# Patient Record
Sex: Female | Born: 1960 | Race: Asian | Hispanic: No | Marital: Married | Smoking: Never smoker
Health system: Southern US, Community
[De-identification: ages and names within clinical notes are randomized; demographics above are authoritative.]

## PROBLEM LIST (undated history)

## (undated) DIAGNOSIS — J45909 Unspecified asthma, uncomplicated: Secondary | ICD-10-CM

## (undated) DIAGNOSIS — I10 Essential (primary) hypertension: Secondary | ICD-10-CM

---

## 2002-02-14 ENCOUNTER — Other Ambulatory Visit: Admission: RE | Admit: 2002-02-14 | Discharge: 2002-02-14 | Payer: Self-pay | Admitting: Obstetrics & Gynecology

## 2002-09-09 ENCOUNTER — Inpatient Hospital Stay (HOSPITAL_COMMUNITY): Admission: AD | Admit: 2002-09-09 | Discharge: 2002-09-12 | Payer: Self-pay | Admitting: Obstetrics & Gynecology

## 2002-10-17 ENCOUNTER — Other Ambulatory Visit: Admission: RE | Admit: 2002-10-17 | Discharge: 2002-10-17 | Payer: Self-pay | Admitting: Obstetrics & Gynecology

## 2013-11-20 ENCOUNTER — Ambulatory Visit: Payer: Self-pay | Admitting: Cardiology

## 2016-09-30 ENCOUNTER — Encounter (HOSPITAL_BASED_OUTPATIENT_CLINIC_OR_DEPARTMENT_OTHER): Payer: Self-pay

## 2016-09-30 ENCOUNTER — Emergency Department (HOSPITAL_BASED_OUTPATIENT_CLINIC_OR_DEPARTMENT_OTHER)
Admission: EM | Admit: 2016-09-30 | Discharge: 2016-10-01 | Disposition: A | Discharge status: Home / Self Care | Payer: BLUE CROSS/BLUE SHIELD | Attending: Emergency Medicine | Admitting: Emergency Medicine

## 2016-09-30 DIAGNOSIS — E876 Hypokalemia: Secondary | ICD-10-CM | POA: Insufficient documentation

## 2016-09-30 DIAGNOSIS — Z79899 Other long term (current) drug therapy: Secondary | ICD-10-CM | POA: Insufficient documentation

## 2016-09-30 DIAGNOSIS — R1031 Right lower quadrant pain: Secondary | ICD-10-CM | POA: Diagnosis present

## 2016-09-30 DIAGNOSIS — J45909 Unspecified asthma, uncomplicated: Secondary | ICD-10-CM | POA: Diagnosis not present

## 2016-09-30 DIAGNOSIS — I10 Essential (primary) hypertension: Secondary | ICD-10-CM | POA: Insufficient documentation

## 2016-09-30 DIAGNOSIS — K5732 Diverticulitis of large intestine without perforation or abscess without bleeding: Secondary | ICD-10-CM | POA: Insufficient documentation

## 2016-09-30 HISTORY — DX: Essential (primary) hypertension: I10

## 2016-09-30 HISTORY — DX: Unspecified asthma, uncomplicated: J45.909

## 2016-09-30 LAB — COMPREHENSIVE METABOLIC PANEL WITH GFR
ALT: 25 U/L (ref 14–54)
AST: 27 U/L (ref 15–41)
Albumin: 4.1 g/dL (ref 3.5–5.0)
Alkaline Phosphatase: 50 U/L (ref 38–126)
Anion gap: 14 (ref 5–15)
BUN: 15 mg/dL (ref 6–20)
CO2: 26 mmol/L (ref 22–32)
Calcium: 9.2 mg/dL (ref 8.9–10.3)
Chloride: 95 mmol/L — ABNORMAL LOW (ref 101–111)
Creatinine, Ser: 0.68 mg/dL (ref 0.44–1.00)
GFR calc Af Amer: >60 mL/min
GFR calc non Af Amer: >60 mL/min
Glucose, Bld: 113 mg/dL — ABNORMAL HIGH (ref 65–99)
Potassium: 2.8 mmol/L — ABNORMAL LOW (ref 3.5–5.1)
Sodium: 135 mmol/L (ref 135–145)
Total Bilirubin: 0.6 mg/dL (ref 0.3–1.2)
Total Protein: 8 g/dL (ref 6.5–8.1)

## 2016-09-30 LAB — CBC
HCT: 39.4 % (ref 36.0–46.0)
Hemoglobin: 13.6 g/dL (ref 12.0–15.0)
MCH: 29.1 pg (ref 26.0–34.0)
MCHC: 34.5 g/dL (ref 30.0–36.0)
MCV: 84.2 fL (ref 78.0–100.0)
Platelets: 261 K/uL (ref 150–400)
RBC: 4.68 MIL/uL (ref 3.87–5.11)
RDW: 12.9 % (ref 11.5–15.5)
WBC: 18.1 K/uL — ABNORMAL HIGH (ref 4.0–10.5)

## 2016-09-30 LAB — URINALYSIS, ROUTINE W REFLEX MICROSCOPIC
BILIRUBIN URINE: NEGATIVE
GLUCOSE, UA: NEGATIVE mg/dL
KETONES UR: NEGATIVE mg/dL
Leukocytes, UA: NEGATIVE
Nitrite: NEGATIVE
PH: 7.5 (ref 5.0–8.0)
Protein, ur: NEGATIVE mg/dL
SPECIFIC GRAVITY, URINE: 1.004 — AB (ref 1.005–1.030)

## 2016-09-30 LAB — URINALYSIS, MICROSCOPIC (REFLEX): WBC, UA: NONE SEEN WBC/hpf (ref 0–5)

## 2016-09-30 LAB — LIPASE, BLOOD: LIPASE: 28 U/L (ref 11–51)

## 2016-09-30 NOTE — ED Triage Notes (Signed)
C/o abd pain x 2-3 days-NAD-steady gait

## 2016-09-30 NOTE — ED Provider Notes (Addendum)
[facility] Provider Note: Lowella Dell, MD, FACEP  CSN: [id number] MRN: [id number] ARRIVAL: 09/30/16 at 2156 ROOM: MH09/[facility]09   CHIEF COMPLAINT  Abdominal Pain   HISTORY OF PRESENT ILLNESS  09/30/16 11:56 PM Alejandra Long is a 56 y.o. female with a two-day history of right lower quadrant pain. She rates her pain as a 7 out of 10 and describes it as "a pain". The onset was gradual. There has been associated nausea and anorexia but no vomiting or diarrhea. She was noted to have a low-grade fever 100.5 on arrival. Pain is worse with movement or palpation. She has had chills as well but no dysuria.   Past Medical History:  Diagnosis Date  . Asthma   . Hypertension     History reviewed. No pertinent surgical history.  No family history on file.  Social History  Substance Use Topics  . Smoking status: Never Smoker  . Smokeless tobacco: Never Used  . Alcohol use No    Prior to Admission medications   Medication Sig Start Date End Date Taking? Authorizing Provider  AMLODIPINE BESYLATE PO Take by mouth.   Yes [provider]  HYDRALAZINE-HCTZ PO Take by mouth.   Yes [provider]    Allergies Patient has no known allergies.   REVIEW OF SYSTEMS  Negative except as noted here or in the History of Present Illness.   PHYSICAL EXAMINATION  Initial Vital Signs Blood pressure 140/90, pulse 96, temperature (!) 100.5 F (38.1 C), temperature source Oral, resp. rate 18, height 5' (1.524 m), weight 49.9 kg (110 lb), SpO2 100 %.  Examination General: Well-developed, well-nourished female in no acute distress; appearance consistent with age of record HENT: normocephalic; atraumatic Eyes: pupils equal, round and reactive to light; extraocular muscles intact Neck: supple Heart: regular rate and rhythm Lungs: clear to auscultation bilaterally Abdomen: soft; nondistended; right lower quadrant tenderness; no masses or hepatosplenomegaly; bowel  sounds present Extremities: No deformity; full range of motion; pulses normal Neurologic: Awake, alert and oriented; motor function intact in all extremities and symmetric; no facial droop Skin: Warm and dry Psychiatric: Normal mood and affect   RESULTS  Summary of this visit's results, reviewed by myself:   EKG Interpretation  Date/Time:    Ventricular Rate:    PR Interval:    QRS Duration:   QT Interval:    QTC Calculation:   R Axis:     Text Interpretation:        Laboratory Studies: Results for orders placed or performed during the hospital encounter of 09/30/16 (from the past 24 hour(s))  Urinalysis, Routine w reflex microscopic     Status: Abnormal   Collection Time: 09/30/16 10:00 PM  Result Value Ref Range   Color, Urine YELLOW YELLOW   APPearance CLEAR CLEAR   Specific Gravity, Urine 1.004 (L) 1.005 - 1.030   pH 7.5 5.0 - 8.0   Glucose, UA NEGATIVE NEGATIVE mg/dL   Hgb urine dipstick SMALL (A) NEGATIVE   Bilirubin Urine NEGATIVE NEGATIVE   Ketones, ur NEGATIVE NEGATIVE mg/dL   Protein, ur NEGATIVE NEGATIVE mg/dL   Nitrite NEGATIVE NEGATIVE   Leukocytes, UA NEGATIVE NEGATIVE  Urinalysis, Microscopic (reflex)     Status: Abnormal   Collection Time: 09/30/16 10:00 PM  Result Value Ref Range   RBC / HPF 0-5 0 - 5 RBC/hpf   WBC, UA NONE SEEN 0 - 5 WBC/hpf   Bacteria, UA RARE (A) NONE SEEN   Squamous Epithelial / LPF 0-5 (  A) NONE SEEN  Lipase, blood     Status: None   Collection Time: 09/30/16 10:14 PM  Result Value Ref Range   Lipase 28 11 - 51 U/L  Comprehensive metabolic panel     Status: Abnormal   Collection Time: 09/30/16 10:14 PM  Result Value Ref Range   Sodium 135 135 - 145 mmol/L   Potassium 2.8 (L) 3.5 - 5.1 mmol/L   Chloride 95 (L) 101 - 111 mmol/L   CO2 26 22 - 32 mmol/L   Glucose, Bld 113 (H) 65 - 99 mg/dL   BUN 15 6 - 20 mg/dL   Creatinine, Ser 1.61 0.44 - 1.00 mg/dL   Calcium 9.2 8.9 - 09.6 mg/dL   Total Protein 8.0 6.5 - 8.1 g/dL    Albumin 4.1 3.5 - 5.0 g/dL   AST 27 15 - 41 U/L   ALT 25 14 - 54 U/L   Alkaline Phosphatase 50 38 - 126 U/L   Total Bilirubin 0.6 0.3 - 1.2 mg/dL   GFR calc non Af Amer >60 >60 mL/min   GFR calc Af Amer >60 >60 mL/min   Anion gap 14 5 - 15  CBC     Status: Abnormal   Collection Time: 09/30/16 10:14 PM  Result Value Ref Range   WBC 18.1 (H) 4.0 - 10.5 K/uL   RBC 4.68 3.87 - 5.11 MIL/uL   Hemoglobin 13.6 12.0 - 15.0 g/dL   HCT 04.5 40.9 - 81.1 %   MCV 84.2 78.0 - 100.0 fL   MCH 29.1 26.0 - 34.0 pg   MCHC 34.5 30.0 - 36.0 g/dL   RDW 91.4 78.2 - 95.6 %   Platelets 261 150 - 400 K/uL   Imaging Studies: Ct Abdomen Pelvis W Contrast  Result Date: 10/01/2016 CLINICAL DATA:  Acute onset of right lower quadrant abdominal pain. Leukocytosis. Initial encounter. EXAM: CT ABDOMEN AND PELVIS WITH CONTRAST TECHNIQUE: Multidetector CT imaging of the abdomen and pelvis was performed using the standard protocol following bolus administration of intravenous contrast. CONTRAST:  ISOVUE-300 IOPAMIDOL (ISOVUE-300) INJECTION 61% COMPARISON:  None. FINDINGS: Lower chest: The visualized lung bases are grossly clear. The visualized portions of the mediastinum are unremarkable. Hepatobiliary: The liver is unremarkable in appearance. The gallbladder is unremarkable in appearance. The common bile duct remains normal in caliber. Pancreas: The pancreas is within normal limits. Spleen: The spleen is unremarkable in appearance. Adrenals/Urinary Tract: The adrenal glands are unremarkable in appearance. Scattered small bilateral renal cysts are seen. There is no evidence of hydronephrosis. No renal or ureteral stones are identified. Right-sided perinephric stranding reflects the adjacent colonic process. Stomach/Bowel: There is marked wall thickening along the ascending colon, with surrounding soft tissue inflammation and fluid. Inflamed diverticula are noted, compatible with acute diverticulitis. There is no definite  evidence of perforation or abscess formation at this time. Scattered diverticulosis is noted along the ascending colon. The small bowel is grossly unremarkable in appearance. The stomach is within normal limits. The appendix is normal in caliber, without evidence of appendicitis. Vascular/Lymphatic: Scattered calcification is seen along the abdominal aorta and its branches. The abdominal aorta is otherwise grossly unremarkable. The inferior vena cava is grossly unremarkable. No retroperitoneal lymphadenopathy is seen. No pelvic sidewall lymphadenopathy is identified. Reproductive: The bladder is mildly distended and grossly unremarkable. Several uterine fibroids are noted, with scattered calcification. The ovaries are grossly symmetric. No suspicious adnexal masses are seen. Other: No additional soft tissue abnormalities are seen. Musculoskeletal: No acute osseous abnormalities  are identified. The visualized musculature is unremarkable in appearance. IMPRESSION: 1. Marked wall thickening along the ascending colon, with surrounding soft tissue inflammation and fluid. Inflamed diverticula noted, compatible with acute diverticulitis. No definite evidence of perforation or abscess formation at this time. 2. Scattered diverticulosis along the ascending colon. 3. Scattered aortic atherosclerosis. 4. Small bilateral renal cysts noted. 5. Several uterine fibroids noted, with scattered calcification. Electronically Signed   By: Roanna RaiderJeffery  Chang M.D.   On: 10/01/2016 02:41    ED COURSE  Nursing notes and initial vitals signs, including pulse oximetry, reviewed.  Vitals:   09/30/16 2213 10/01/16 0018 10/01/16 0330  BP: 140/90 (!) 141/96 103/78  Pulse: 96 97 84  Resp: 18 13 18   Temp: (!) 100.5 F (38.1 C)    TempSrc: Oral    SpO2: 100% 97% 100%  Weight: 49.9 kg (110 lb)    Height: 5' (1.524 m)     Patient given IV Cipro and Flagyl in the ED. We'll send her home on oral Cipro and Flagyl. She was advised to return  for worsening symptoms.  PROCEDURES    ED DIAGNOSES     ICD-10-CM   1. Diverticulitis of large intestine without perforation or abscess without bleeding K57.32   2. Hypokalemia E87.6        Evangelene Vora, Jonny RuizJohn, MD 10/01/16 0436    Paula LibraMolpus, Jhene Westmoreland, MD 10/01/16 [phone]

## 2016-10-01 ENCOUNTER — Emergency Department (HOSPITAL_BASED_OUTPATIENT_CLINIC_OR_DEPARTMENT_OTHER): Payer: BLUE CROSS/BLUE SHIELD

## 2016-10-01 MED ORDER — POTASSIUM CHLORIDE CRYS ER 20 MEQ PO TBCR
40.0000 meq | EXTENDED_RELEASE_TABLET | Freq: Once | ORAL | Status: AC
  Administered 2016-10-01: 40 meq via ORAL
  Filled 2016-10-01: qty 2

## 2016-10-01 MED ORDER — METRONIDAZOLE 500 MG PO TABS: 500.0000 mg | ORAL_TABLET | Freq: Three times a day (TID) | ORAL | 0 refills | Status: DC

## 2016-10-01 MED ORDER — METRONIDAZOLE IN NACL 5-0.79 MG/ML-% IV SOLN
500.0000 mg | Freq: Once | INTRAVENOUS | Status: AC
  Administered 2016-10-01: 500 mg via INTRAVENOUS
  Filled 2016-10-01: qty 100

## 2016-10-01 MED ORDER — IOPAMIDOL (ISOVUE-300) INJECTION 61%
100.0000 mL | Freq: Once | INTRAVENOUS | Status: AC | PRN
  Administered 2016-10-01: 100 mL via INTRAVENOUS

## 2016-10-01 MED ORDER — SODIUM CHLORIDE 0.9 % IV SOLN
Freq: Once | INTRAVENOUS | Status: AC
  Administered 2016-10-01: 1000 mL via INTRAVENOUS

## 2016-10-01 MED ORDER — CIPROFLOXACIN HCL 500 MG PO TABS: 500.0000 mg | ORAL_TABLET | Freq: Two times a day (BID) | ORAL | 0 refills | Status: DC

## 2016-10-01 MED ORDER — POTASSIUM CHLORIDE CRYS ER 20 MEQ PO TBCR: 20.0000 meq | EXTENDED_RELEASE_TABLET | Freq: Two times a day (BID) | ORAL | 0 refills | Status: DC

## 2016-10-01 MED ORDER — CIPROFLOXACIN IN D5W 400 MG/200ML IV SOLN
400.0000 mg | Freq: Once | INTRAVENOUS | Status: AC
  Administered 2016-10-01: 400 mg via INTRAVENOUS
  Filled 2016-10-01: qty 200

## 2016-10-01 MED ORDER — ONDANSETRON HCL 4 MG/2ML IJ SOLN
4.0000 mg | Freq: Once | INTRAMUSCULAR | Status: DC
  Filled 2016-10-01: qty 2

## 2017-01-21 DIAGNOSIS — E785 Hyperlipidemia, unspecified: Secondary | ICD-10-CM | POA: Diagnosis not present

## 2017-02-10 DIAGNOSIS — I10 Essential (primary) hypertension: Secondary | ICD-10-CM | POA: Diagnosis not present

## 2017-02-10 DIAGNOSIS — K5792 Diverticulitis of intestine, part unspecified, without perforation or abscess without bleeding: Secondary | ICD-10-CM | POA: Diagnosis not present

## 2017-02-10 DIAGNOSIS — E785 Hyperlipidemia, unspecified: Secondary | ICD-10-CM | POA: Diagnosis not present

## 2017-02-10 DIAGNOSIS — E876 Hypokalemia: Secondary | ICD-10-CM | POA: Diagnosis not present

## 2017-02-18 DIAGNOSIS — M25511 Pain in right shoulder: Secondary | ICD-10-CM | POA: Diagnosis not present

## 2017-02-24 DIAGNOSIS — M25511 Pain in right shoulder: Secondary | ICD-10-CM | POA: Diagnosis not present

## 2017-02-28 DIAGNOSIS — M25511 Pain in right shoulder: Secondary | ICD-10-CM | POA: Diagnosis not present

## 2017-03-03 DIAGNOSIS — M25511 Pain in right shoulder: Secondary | ICD-10-CM | POA: Diagnosis not present

## 2017-03-08 DIAGNOSIS — M25511 Pain in right shoulder: Secondary | ICD-10-CM | POA: Diagnosis not present

## 2017-03-16 DIAGNOSIS — M25511 Pain in right shoulder: Secondary | ICD-10-CM | POA: Diagnosis not present

## 2017-03-23 DIAGNOSIS — M25511 Pain in right shoulder: Secondary | ICD-10-CM | POA: Diagnosis not present

## 2017-04-01 DIAGNOSIS — M25511 Pain in right shoulder: Secondary | ICD-10-CM | POA: Diagnosis not present

## 2017-04-05 DIAGNOSIS — M25511 Pain in right shoulder: Secondary | ICD-10-CM | POA: Diagnosis not present

## 2017-04-14 DIAGNOSIS — M25511 Pain in right shoulder: Secondary | ICD-10-CM | POA: Diagnosis not present

## 2017-08-09 DIAGNOSIS — E785 Hyperlipidemia, unspecified: Secondary | ICD-10-CM | POA: Diagnosis not present

## 2017-08-09 DIAGNOSIS — I10 Essential (primary) hypertension: Secondary | ICD-10-CM | POA: Diagnosis not present

## 2017-08-12 DIAGNOSIS — E785 Hyperlipidemia, unspecified: Secondary | ICD-10-CM | POA: Diagnosis not present

## 2017-08-12 DIAGNOSIS — I10 Essential (primary) hypertension: Secondary | ICD-10-CM | POA: Diagnosis not present

## 2018-02-14 DIAGNOSIS — E785 Hyperlipidemia, unspecified: Secondary | ICD-10-CM | POA: Diagnosis not present

## 2018-02-14 DIAGNOSIS — I10 Essential (primary) hypertension: Secondary | ICD-10-CM | POA: Diagnosis not present

## 2018-02-21 DIAGNOSIS — I10 Essential (primary) hypertension: Secondary | ICD-10-CM | POA: Diagnosis not present

## 2018-02-21 DIAGNOSIS — E785 Hyperlipidemia, unspecified: Secondary | ICD-10-CM | POA: Diagnosis not present

## 2018-04-26 ENCOUNTER — Encounter: Payer: Self-pay | Admitting: Obstetrics & Gynecology

## 2018-04-26 ENCOUNTER — Other Ambulatory Visit: Payer: Self-pay

## 2018-04-26 ENCOUNTER — Ambulatory Visit (INDEPENDENT_AMBULATORY_CARE_PROVIDER_SITE_OTHER): Payer: BLUE CROSS/BLUE SHIELD | Admitting: Obstetrics & Gynecology

## 2018-04-26 VITALS — BP 130/80 | Ht <= 58 in | Wt 109.0 lb

## 2018-04-26 DIAGNOSIS — Z9189 Other specified personal risk factors, not elsewhere classified: Secondary | ICD-10-CM

## 2018-04-26 DIAGNOSIS — Z1151 Encounter for screening for human papillomavirus (HPV): Secondary | ICD-10-CM

## 2018-04-26 DIAGNOSIS — Z780 Asymptomatic menopausal state: Secondary | ICD-10-CM

## 2018-04-26 DIAGNOSIS — Z01419 Encounter for gynecological examination (general) (routine) without abnormal findings: Secondary | ICD-10-CM

## 2018-04-26 NOTE — Progress Notes (Signed)
Alejandra Long April 08, 1960 [id number]   History:    58 y.o. G3P3L3 Married.  Vasectomy.  3 daughters 96, 59, 69 yo.  2 oldest in [place], youngest [employer].  RP:  New (last seen in 2011) patient presenting for annual gyn exam   HPI: Menopause x3 years, well on no HRT.  No PMB.  No pelvic pain.  No pain with IC.  Urine/BMs normal.  Breasts normal.  BMI 22.78.  Good fitness.  Health Labs with Fam MD.  Treated for Hypercholesterolemia and HTN.  Past medical history,surgical history, family history and social history were all reviewed and documented in the EPIC chart.  Gynecologic History No LMP recorded. Patient is postmenopausal. Contraception: post menopausal status and vasectomy Last Pap: 05/2009. Results were: Negative Last mammogram: Never Bone Density: Never Colonoscopy: Never  Obstetric History OB History  Gravida Para Term Preterm AB Living  3 3       3   SAB TAB Ectopic Multiple Live Births               # Outcome Date GA Lbr Len/2nd Weight Sex Delivery Anes PTL Lv  3 Para           2 Para           1 Para              ROS: A ROS was performed and pertinent positives and negatives are included in the history.  GENERAL: No fevers or chills. HEENT: No change in vision, no earache, sore throat or sinus congestion. NECK: No pain or stiffness. CARDIOVASCULAR: No chest pain or pressure. No palpitations. PULMONARY: No shortness of breath, cough or wheeze. GASTROINTESTINAL: No abdominal pain, nausea, vomiting or diarrhea, melena or bright red blood per rectum. GENITOURINARY: No urinary frequency, urgency, hesitancy or dysuria. MUSCULOSKELETAL: No joint or muscle pain, no back pain, no recent trauma. DERMATOLOGIC: No rash, no itching, no lesions. ENDOCRINE: No polyuria, polydipsia, no heat or cold intolerance. No recent change in weight. HEMATOLOGICAL: No anemia or easy bruising or bleeding. NEUROLOGIC: No headache, seizures, numbness, tingling or weakness.  PSYCHIATRIC: No depression, no loss of interest in normal activity or change in sleep pattern.     Exam:   BP 130/80   Ht 4\' 10"  (1.473 m)   Wt 109 lb (49.4 kg)   BMI 22.78 kg/m   Body mass index is 22.78 kg/m.  General appearance : Well developed well nourished female. No acute distress HEENT: Eyes: no retinal hemorrhage or exudates,  Neck supple, trachea midline, no carotid bruits, no thyroidmegaly Lungs: Clear to auscultation, no rhonchi or wheezes, or rib retractions  Heart: Regular rate and rhythm, no murmurs or gallops Breast:Examined in sitting and supine position were symmetrical in appearance, no palpable masses or tenderness,  no skin retraction, no nipple inversion, no nipple discharge, no skin discoloration, no axillary or supraclavicular lymphadenopathy Abdomen: no palpable masses or tenderness, no rebound or guarding Extremities: no edema or skin discoloration or tenderness  Pelvic: Vulva: Normal             Vagina: No gross lesions or discharge  Cervix: No gross lesions or discharge.  Pap/HPV HR done.  Uterus  AV, normal size, shape and consistency, non-tender and mobile  Adnexa  Without masses or tenderness  Anus: Normal   Assessment/Plan:  59 y.o. female for annual exam   1. Encounter for routine gynecological examination with Papanicolaou smear of cervix Normal gynecologic exam in menopause.  Pap/HPV HR done today.  Breast exam normal.  [facility]  [facility] to [facility] for a Screening Colonoscopy.  Health labs with Fam MD and management of cHTN/Hypercholesterolemia.  Good BMI at 22.78.  Healthy nutrition and Fitness with aerobic activities 5 times a week and weightlifting every 2 days.  2. Relies on partner vasectomy for contraception And Postmenopausal.  3. Postmenopausal Well on no HRT.  No PMB.  Continue on Vit D supplements and Ca++ intake of 1.5 g/day including supplements and nutritional.  Regular weight bearing  physical activity.  Will do a Bone Density at or before 58 yo.  Other orders - rosuvastatin (CRESTOR) 5 MG tablet; Take 5 mg by mouth daily. - triamterene-hydrochlorothiazide (DYAZIDE) 37.5-25 MG capsule; Take 1 capsule by mouth daily. - Multiple Vitamin (MULTIVITAMIN) tablet; Take 1 tablet by mouth daily. - Calcium Carb-Cholecalciferol (CALCIUM 1000 + D PO); Take by mouth. - Omega-3 Fatty Acids (FISH OIL) 1000 MG CPDR; Take by mouth.  Alejandra Del MD, 10:25 AM 04/26/2018

## 2018-04-26 NOTE — Addendum Note (Signed)
Addended by: Berna Spare A on: 04/26/2018 11:45 AM   Modules accepted: Orders

## 2018-04-26 NOTE — Patient Instructions (Signed)
1. Encounter for routine gynecological examination with Papanicolaou smear of cervix Normal gynecologic exam in menopause.  Pap/HPV HR done today.  Breast exam normal.  [facility]  [facility]er to [facility] for a Screening Colonoscopy.  Health labs with Fam MD and management of cHTN/Hypercholesterolemia.  Good BMI at 22.78.  Healthy nutrition and Fitness with aerobic activities 5 times a week and weightlifting every 2 days.  2. Relies on partner vasectomy for contraception And Postmenopausal.  3. Postmenopausal Well on no HRT.  No PMB.  Continue on Vit D supplements and Ca++ intake of 1.5 g/day including supplements and nutritional.  Regular weight bearing physical activity.  Will do a Bone Density at or before 58 yo.  Other orders - rosuvastatin (CRESTOR) 5 MG tablet; Take 5 mg by mouth daily. - triamterene-hydrochlorothiazide (DYAZIDE) 37.5-25 MG capsule; Take 1 capsule by mouth daily. - Multiple Vitamin (MULTIVITAMIN) tablet; Take 1 tablet by mouth daily. - Calcium Carb-Cholecalciferol (CALCIUM 1000 + D PO); Take by mouth. - Omega-3 Fatty Acids (FISH OIL) 1000 MG CPDR; Take by mouth.  Alejandra Long, it was a pleasure seeing you today!  I will inform you of your results as soon as they are available.

## 2018-04-27 LAB — PAP, TP IMAGING W/ HPV RNA, RFLX HPV TYPE 16,18/45: HPV DNA High Risk: NOT DETECTED

## 2018-08-24 DIAGNOSIS — E785 Hyperlipidemia, unspecified: Secondary | ICD-10-CM | POA: Diagnosis not present

## 2018-08-29 DIAGNOSIS — M25512 Pain in left shoulder: Secondary | ICD-10-CM | POA: Diagnosis not present

## 2018-08-29 DIAGNOSIS — M25511 Pain in right shoulder: Secondary | ICD-10-CM | POA: Diagnosis not present

## 2018-08-29 DIAGNOSIS — I10 Essential (primary) hypertension: Secondary | ICD-10-CM | POA: Diagnosis not present

## 2018-08-29 DIAGNOSIS — E785 Hyperlipidemia, unspecified: Secondary | ICD-10-CM | POA: Diagnosis not present

## 2018-11-01 DIAGNOSIS — Z23 Encounter for immunization: Secondary | ICD-10-CM | POA: Diagnosis not present

## 2018-11-15 ENCOUNTER — Other Ambulatory Visit: Payer: Self-pay | Admitting: Obstetrics & Gynecology

## 2018-11-15 DIAGNOSIS — Z1231 Encounter for screening mammogram for malignant neoplasm of breast: Secondary | ICD-10-CM

## 2018-12-01 DIAGNOSIS — L2089 Other atopic dermatitis: Secondary | ICD-10-CM | POA: Diagnosis not present

## 2018-12-01 DIAGNOSIS — D1801 Hemangioma of skin and subcutaneous tissue: Secondary | ICD-10-CM | POA: Diagnosis not present

## 2018-12-01 DIAGNOSIS — L821 Other seborrheic keratosis: Secondary | ICD-10-CM | POA: Diagnosis not present

## 2018-12-29 ENCOUNTER — Ambulatory Visit
Admission: RE | Admit: 2018-12-29 | Discharge: 2018-12-29 | Disposition: A | Discharge status: Home / Self Care | Payer: BC Managed Care – PPO | Source: Ambulatory Visit | Attending: Obstetrics & Gynecology | Admitting: Obstetrics & Gynecology

## 2018-12-29 ENCOUNTER — Other Ambulatory Visit: Payer: Self-pay

## 2018-12-29 DIAGNOSIS — Z1231 Encounter for screening mammogram for malignant neoplasm of breast: Secondary | ICD-10-CM | POA: Diagnosis not present

## 2019-01-01 DIAGNOSIS — E785 Hyperlipidemia, unspecified: Secondary | ICD-10-CM | POA: Diagnosis not present

## 2019-01-01 DIAGNOSIS — Z23 Encounter for immunization: Secondary | ICD-10-CM | POA: Diagnosis not present

## 2019-01-01 DIAGNOSIS — I10 Essential (primary) hypertension: Secondary | ICD-10-CM | POA: Diagnosis not present

## 2019-01-05 DIAGNOSIS — I10 Essential (primary) hypertension: Secondary | ICD-10-CM | POA: Diagnosis not present

## 2019-01-05 DIAGNOSIS — E785 Hyperlipidemia, unspecified: Secondary | ICD-10-CM | POA: Diagnosis not present

## 2019-03-30 ENCOUNTER — Ambulatory Visit: Payer: 59

## 2019-05-03 ENCOUNTER — Encounter: Payer: BLUE CROSS/BLUE SHIELD | Admitting: Obstetrics & Gynecology

## 2021-06-13 IMAGING — MG DIGITAL SCREENING BILAT W/ TOMO W/ CAD
8 series · 9 of 24 positions shown · non-contrast
Comparison: None.

CLINICAL DATA: Screening.

EXAM:
DIGITAL SCREENING BILATERAL MAMMOGRAM WITH TOMO AND CAD

[R CC synth-2D]
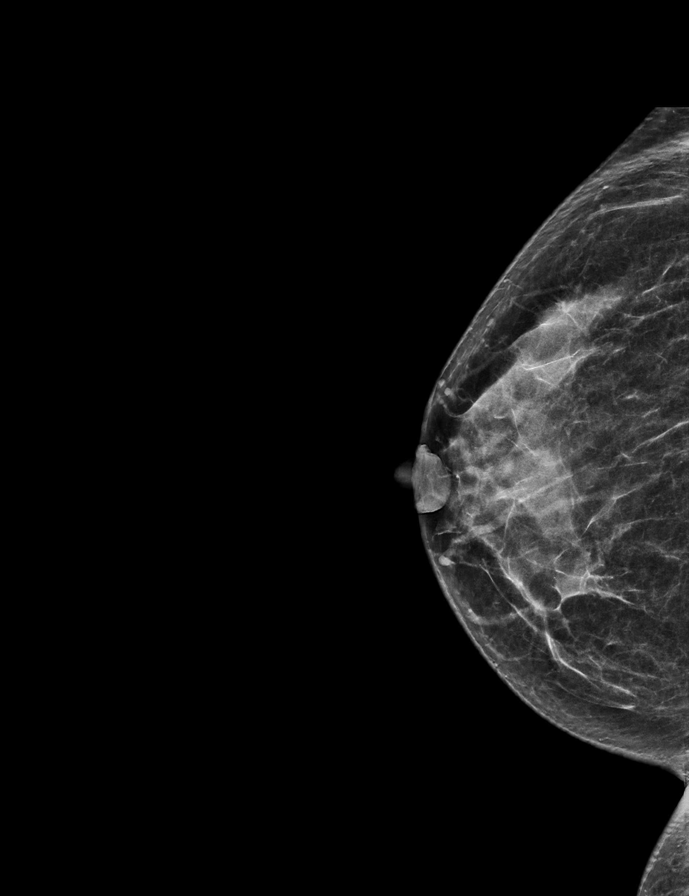

[L MLO synth-2D]
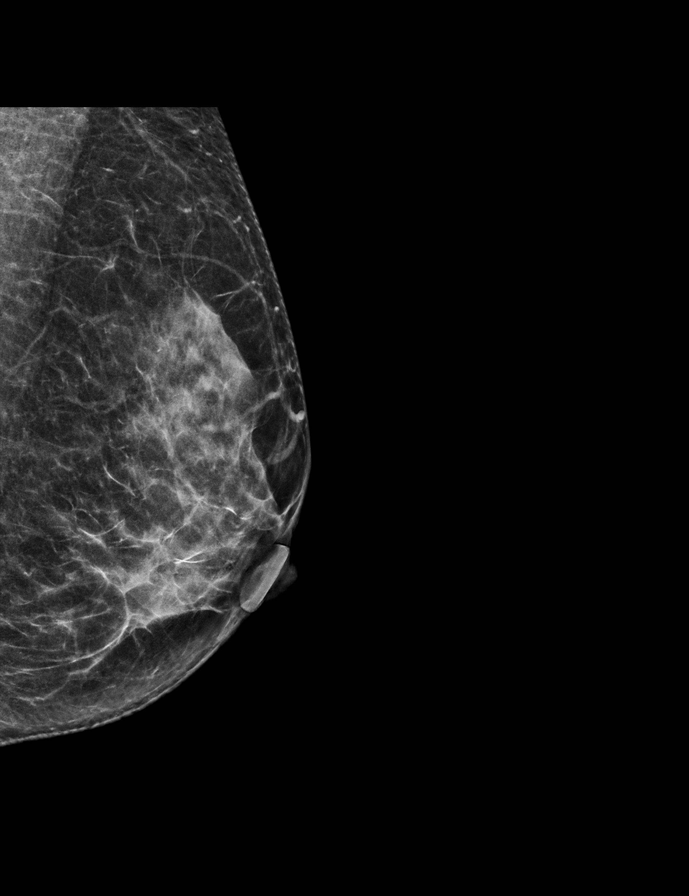

[L CC synth-2D]
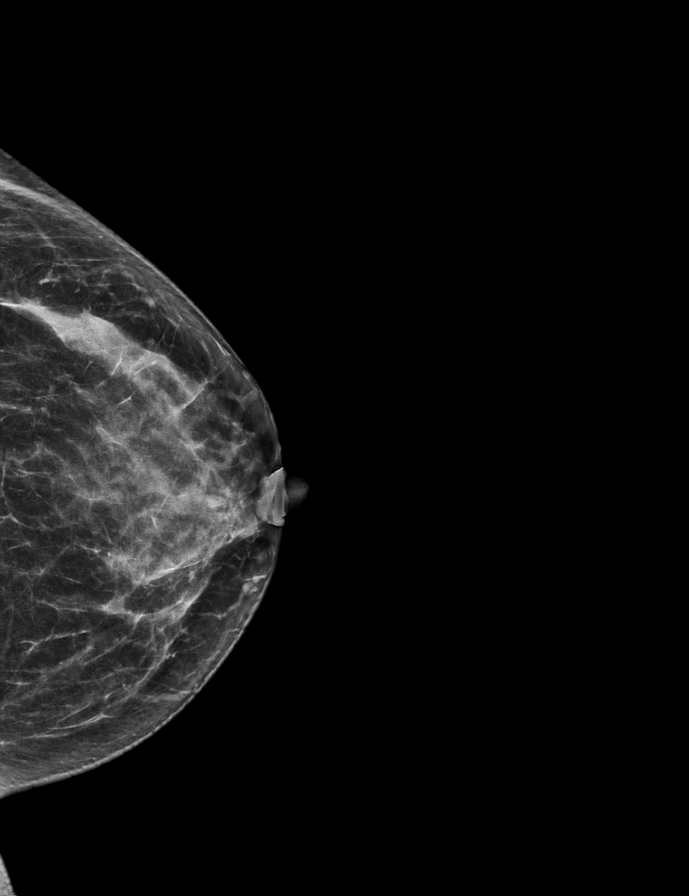

[R MLO synth-2D]
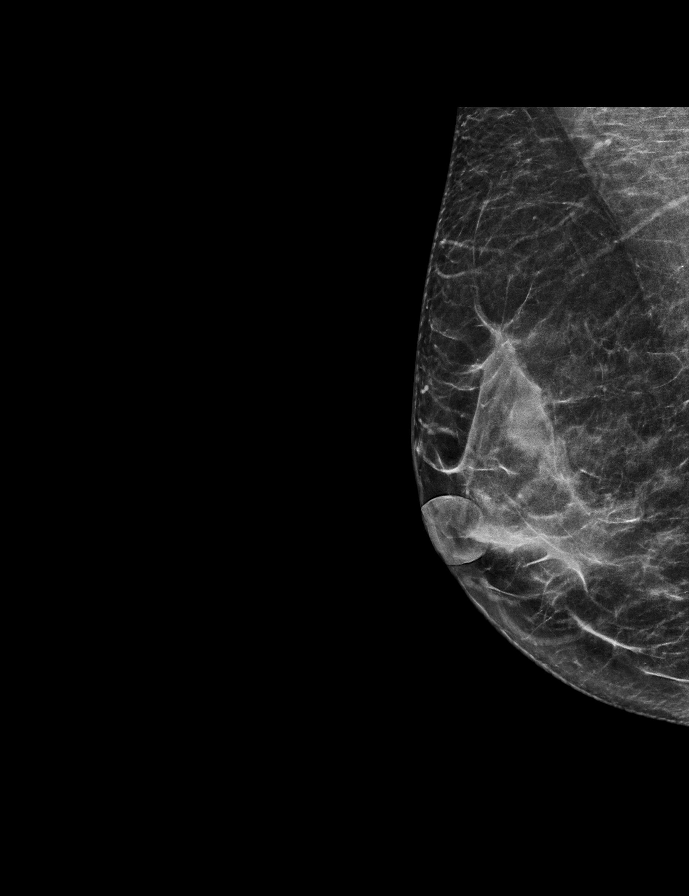

[L CC tomo · 2 of 57 frames shown]
[frame 19/57]
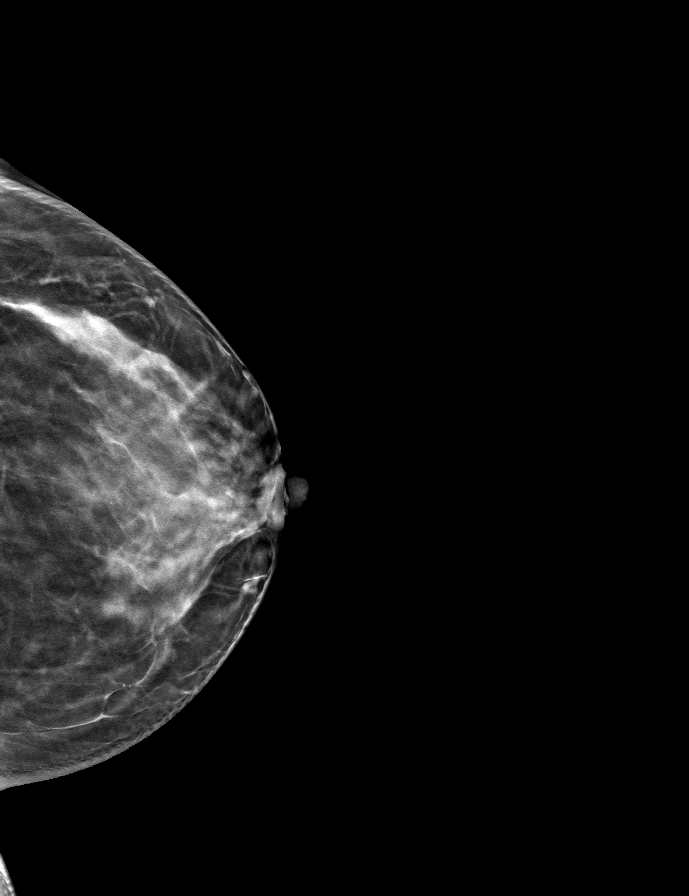
[frame 29/57]
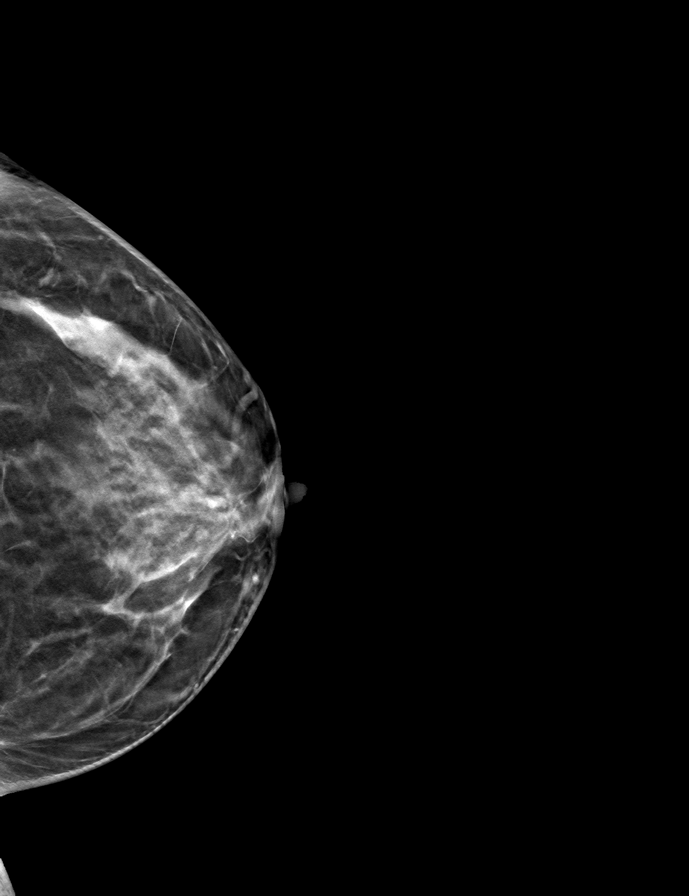

[R CC tomo · tomo slice 28/55.0]
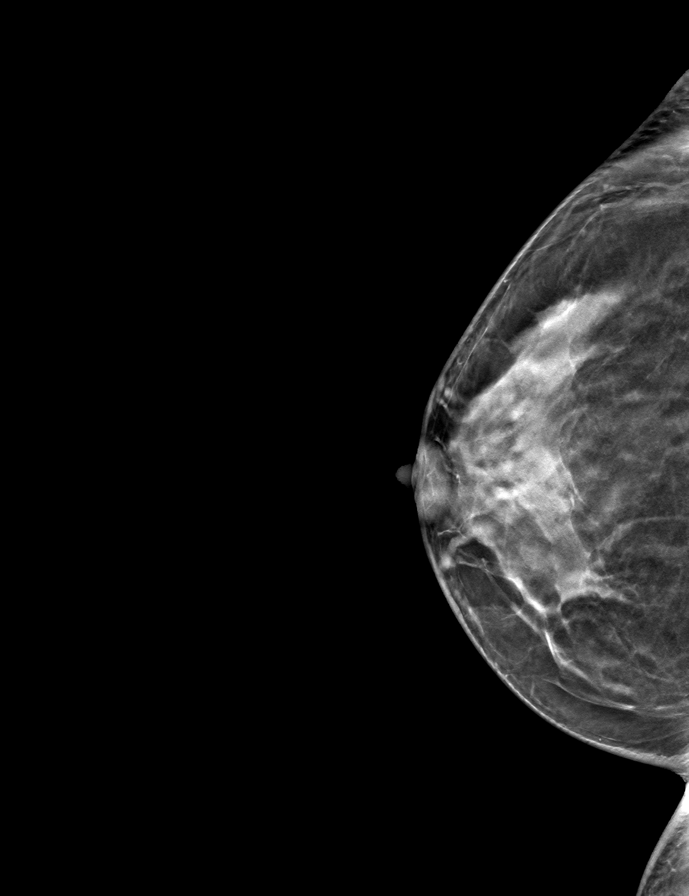

[R MLO tomo · tomo slice 27/53.0]
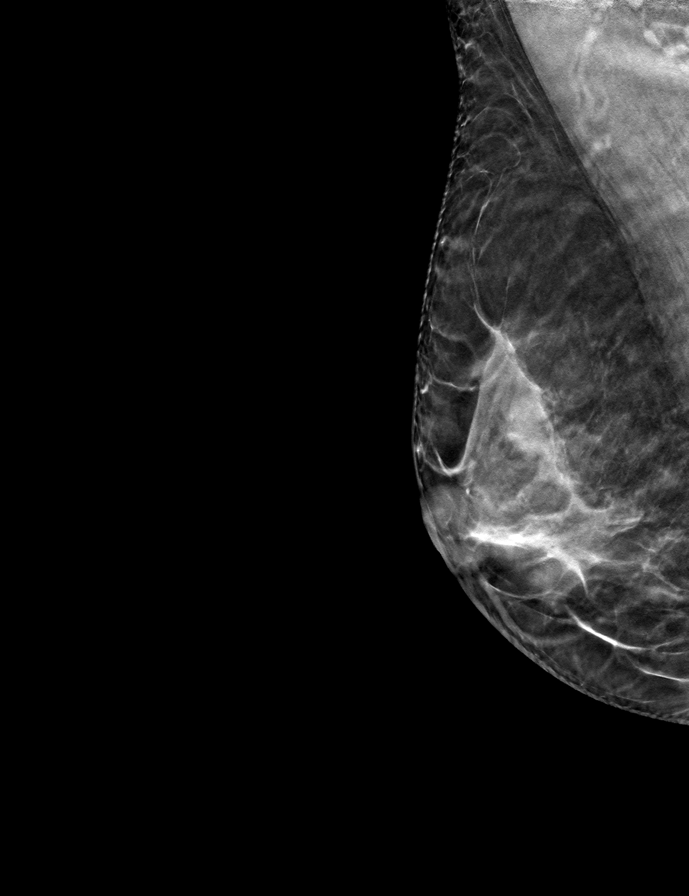

[L MLO tomo · tomo slice 27/53.0]
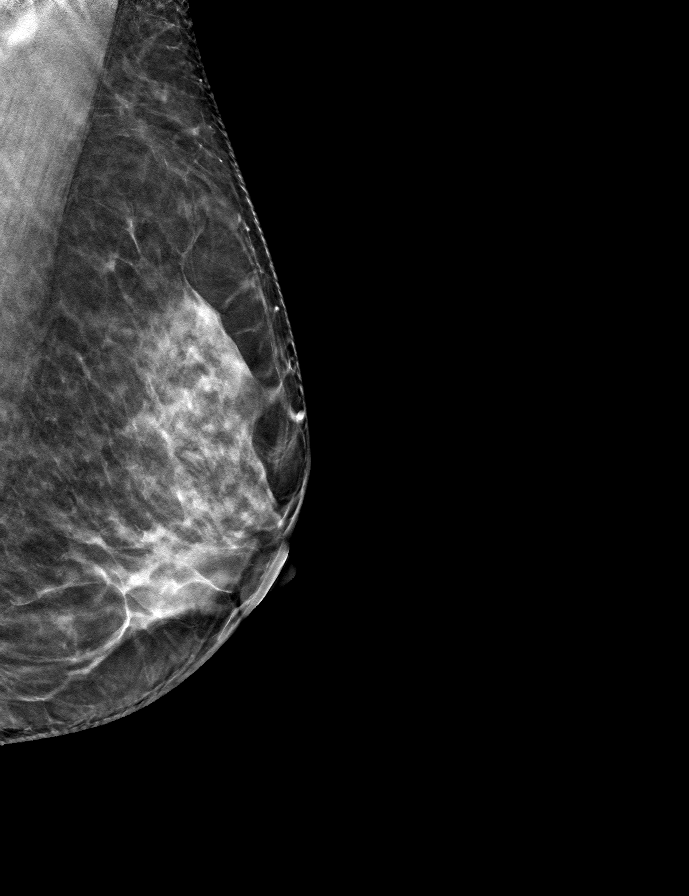

[9 of 24 positions shown; findings below may reference images not displayed]

ACR Breast Density Category c: The breast tissue is heterogeneously
dense, which may obscure small masses
FINDINGS: There are no findings suspicious for malignancy. Images were
processed with CAD.
IMPRESSION: No mammographic evidence of malignancy. A result letter of this
screening mammogram will be mailed directly to the patient.

RECOMMENDATION:
Screening mammogram in one year. (Code:[id number])

BI-RADS CATEGORY  1: Negative.

## 2022-10-28 ENCOUNTER — Other Ambulatory Visit: Payer: Self-pay | Admitting: Internal Medicine

## 2022-10-28 DIAGNOSIS — Z1231 Encounter for screening mammogram for malignant neoplasm of breast: Secondary | ICD-10-CM

## 2022-11-12 ENCOUNTER — Other Ambulatory Visit: Payer: Self-pay

## 2022-11-12 ENCOUNTER — Ambulatory Visit
Admission: RE | Admit: 2022-11-12 | Discharge: 2022-11-12 | Disposition: A | Discharge status: Home / Self Care | Payer: No Typology Code available for payment source | Source: Ambulatory Visit | Attending: Internal Medicine

## 2022-11-12 DIAGNOSIS — Z1231 Encounter for screening mammogram for malignant neoplasm of breast: Secondary | ICD-10-CM

## 2022-11-18 ENCOUNTER — Encounter: Payer: Self-pay | Admitting: Obstetrics and Gynecology

## 2022-11-18 ENCOUNTER — Ambulatory Visit (INDEPENDENT_AMBULATORY_CARE_PROVIDER_SITE_OTHER): Payer: No Typology Code available for payment source | Admitting: Obstetrics and Gynecology

## 2022-11-18 VITALS — BP 110/72 | HR 74 | Ht <= 58 in | Wt 114.0 lb

## 2022-11-18 DIAGNOSIS — Z01419 Encounter for gynecological examination (general) (routine) without abnormal findings: Secondary | ICD-10-CM | POA: Insufficient documentation

## 2022-11-18 NOTE — Patient Instructions (Addendum)
Consider use of water based lubricants, like Good Clean Love, or silicone based lubricants, like Uberlube. Daily application of coconut oil to the vulva will also improve moisture.  Health Maintenance, Female Adopting a healthy lifestyle and getting preventive care are important in promoting health and wellness. Ask your health care provider about: The right schedule for you to have regular tests and exams. Things you can do on your own to prevent diseases and keep yourself healthy. What should I know about diet, weight, and exercise? Eat a healthy diet  Eat a diet that includes plenty of vegetables, fruits, low-fat dairy products, and lean protein. Do not eat a lot of foods that are high in solid fats, added sugars, or sodium. Maintain a healthy weight Body mass index (BMI) is used to identify weight problems. It estimates body fat based on height and weight. Your health care provider can help determine your BMI and help you achieve or maintain a healthy weight. Get regular exercise Get regular exercise. This is one of the most important things you can do for your health. Most adults should: Exercise for at least 150 minutes each week. The exercise should increase your heart rate and make you sweat (moderate-intensity exercise). Do strengthening exercises at least twice a week. This is in addition to the moderate-intensity exercise. Spend less time sitting. Even light physical activity can be beneficial. Watch cholesterol and blood lipids Have your blood tested for lipids and cholesterol at 62 years of age, then have this test every 5 years. Have your cholesterol levels checked more often if: Your lipid or cholesterol levels are high. You are older than 61 years of age. You are at high risk for heart disease. What should I know about cancer screening? Depending on your health history and family history, you may need to have cancer screening at various ages. This may include screening  for: Breast cancer. Cervical cancer. Colorectal cancer. Skin cancer. Lung cancer. What should I know about heart disease, diabetes, and high blood pressure? Blood pressure and heart disease High blood pressure causes heart disease and increases the risk of stroke. This is more likely to develop in people who have high blood pressure readings or are overweight. Have your blood pressure checked: Every 3-5 years if you are 71-42 years of age. Every year if you are 38 years old or older. Diabetes Have regular diabetes screenings. This checks your fasting blood sugar level. Have the screening done: Once every three years after age 79 if you are at a normal weight and have a low risk for diabetes. More often and at a younger age if you are overweight or have a high risk for diabetes. What should I know about preventing infection? Hepatitis B If you have a higher risk for hepatitis B, you should be screened for this virus. Talk with your health care provider to find out if you are at risk for hepatitis B infection. Hepatitis C Testing is recommended for: Everyone born from [birth year] through 1965. Anyone with known risk factors for hepatitis C. Sexually transmitted infections (STIs) Get screened for STIs, including gonorrhea and chlamydia, if: You are sexually active and are younger than 62 years of age. You are older than 62 years of age and your health care provider tells you that you are at risk for this type of infection. Your sexual activity has changed since you were last screened, and you are at increased risk for chlamydia or gonorrhea. Ask your health care provider if you are  at risk. Ask your health care provider about whether you are at high risk for HIV. Your health care provider may recommend a prescription medicine to help prevent HIV infection. If you choose to take medicine to prevent HIV, you should first get tested for HIV. You should then be tested every 3 months for as long as you  are taking the medicine. Osteoporosis and menopause Osteoporosis is a disease in which the bones lose minerals and strength with aging. This can result in bone fractures. If you are 37 years old or older, or if you are at risk for osteoporosis and fractures, ask your health care provider if you should: Be screened for bone loss. Take a calcium or vitamin D supplement to lower your risk of fractures. Be given hormone replacement therapy (HRT) to treat symptoms of menopause. Follow these instructions at home: Alcohol use Do not drink alcohol if: Your health care provider tells you not to drink. You are pregnant, may be pregnant, or are planning to become pregnant. If you drink alcohol: Limit how much you have to: 0-1 drink a day. Know how much alcohol is in your drink. In the [place] one drink equals one 12 oz bottle of beer (355 mL), one 5 oz glass of wine (148 mL), or one 1 oz glass of hard liquor (44 mL). Lifestyle Do not use any products that contain nicotine or tobacco. These products include cigarettes, chewing tobacco, and vaping devices, such as e-cigarettes. If you need help quitting, ask your health care provider. Do not use street drugs. Do not share needles. Ask your health care provider for help if you need support or information about quitting drugs. General instructions Schedule regular health, dental, and eye exams. Stay current with your vaccines. Tell your health care provider if: You often feel depressed. You have ever been abused or do not feel safe at home. Summary Adopting a healthy lifestyle and getting preventive care are important in promoting health and wellness. Follow your health care provider's instructions about healthy diet, exercising, and getting tested or screened for diseases. Follow your health care provider's instructions on monitoring your cholesterol and blood pressure. This information is not intended to replace advice given to you by your health  care provider. Make sure you discuss any questions you have with your health care provider. Document Revised: 06/23/2020 Document Reviewed: 06/23/2020 Elsevier Patient Education  2024 [employer].

## 2022-11-18 NOTE — Progress Notes (Signed)
62 y.o. G3P3 postmenopausal female with asthma HLD, HTN here for annual exam. Married.  No LMP recorded. Patient is postmenopausal.   Pelvic pain, discharge: none Breast mass, nipple discharge or skin changes: none Last PAP: 04/26/2018 NIL Hr HPV Neg, No history of abnormal PAP smear Last mammogram: 11/15/22 Bi-rads 1 neg  Last colonoscopy: 2023 5 year f/u  Sexually active: yes  Exercising: yes  OB History  Gravida Para Term Preterm AB Living  3 3       3   SAB IAB Ectopic Multiple Live Births               # Outcome Date GA Lbr Len/2nd Weight Sex Type Anes PTL Lv  3 Para           2 Para           1 Para             Past Medical History:  Diagnosis Date   Asthma    Hypertension     History reviewed. No pertinent surgical history.  Current Outpatient Medications on File Prior to Visit  Medication Sig Dispense Refill   AMLODIPINE BESYLATE PO Take by mouth.     BIOTIN PO Biotin     Calcium Carb-Cholecalciferol (CALCIUM 1000 + D PO) Take by mouth.     Coenzyme Q10 (COQ-10 PO) CoQ-10     Multiple Vitamin (MULTIVITAMIN) tablet Take 1 tablet by mouth daily.     Multiple Vitamins-Minerals (MULTIVITAMIN ADULT, MINERALS, PO) Multivitamin     Omega-3 Fatty Acids (FISH OIL) 1000 MG CPDR Take by mouth.     rosuvastatin (CRESTOR) 5 MG tablet Take 5 mg by mouth daily.     triamterene-hydrochlorothiazide (DYAZIDE) 37.5-25 MG capsule Take 1 capsule by mouth daily.     No current facility-administered medications on file prior to visit.    Social History   Socioeconomic History   Marital status: Married    Spouse name: Not on file   Number of children: Not on file   Years of education: Not on file   Highest education level: Not on file  Occupational History   Not on file  Tobacco Use   Smoking status: Never   Smokeless tobacco: Never  Vaping Use   Vaping status: Never Used  Substance and Sexual Activity   Alcohol use: No   Drug use: No   Sexual activity: Yes     Birth control/protection: Post-menopausal  Other Topics Concern   Not on file  Social History Narrative   Not on file   Social Determinants of Health   Financial Resource Strain: Not on file  Food Insecurity: Not on file  Transportation Needs: Not on file  Physical Activity: Not on file  Stress: Not on file  Social Connections: Not on file  Intimate Partner Violence: Not on file    Family History  Problem Relation Age of Onset   Hypertension Father    Breast cancer Neg Hx     No Known Allergies    PE Today's Vitals   11/18/22 0907  BP: 110/72  Pulse: 74  SpO2: 98%  Weight: 114 lb (51.7 kg)  Height: 4\' 10"  (1.473 m)   Body mass index is 23.83 kg/m.  Physical Exam Vitals reviewed. Exam conducted with a chaperone present.  Constitutional:      General: She is not in acute distress.    Appearance: Normal appearance.  HENT:     Head: Normocephalic and atraumatic.  Nose: Nose normal.  Eyes:     Extraocular Movements: Extraocular movements intact.     Conjunctiva/sclera: Conjunctivae normal.  Pulmonary:     Effort: Pulmonary effort is normal.  Chest:     Chest wall: No mass or tenderness.  Breasts:    Right: Normal. No swelling, mass, nipple discharge or tenderness.     Left: Normal. No swelling, mass, nipple discharge or tenderness.  Abdominal:     General: There is no distension.     Palpations: Abdomen is soft.     Tenderness: There is no abdominal tenderness.  Genitourinary:    General: Normal vulva.     Exam position: Lithotomy position.     Labia:        Right: No rash, tenderness or lesion.        Left: No rash, tenderness or lesion.      Urethra: No prolapse.     Vagina: Normal. No vaginal discharge or bleeding.     Cervix: Normal. No lesion.     Uterus: Normal. Not enlarged and not tender.      Adnexa: Right adnexa normal and left adnexa normal.  Musculoskeletal:        General: Normal range of motion.     Cervical back: Normal range of  motion.  Lymphadenopathy:     Lower Body: No right inguinal adenopathy. No left inguinal adenopathy.  Skin:    General: Skin is warm and dry.  Neurological:     General: No focal deficit present.     Mental Status: She is alert.  Psychiatric:        Mood and Affect: Mood normal.        Behavior: Behavior normal.       Assessment and Plan:        Well woman exam with routine gynecological exam Assessment & Plan: Cervical cancer screening performed according to ASCCP guidelines. Encouraged annual mammogram screening Colonoscopy UTD DXA, plan at age 36yo Labs and immunizations with her primary Encouraged safe sexual practices as indicated Encouraged healthy lifestyle practices with diet and exercise      Rosalyn Gess, MD

## 2022-11-18 NOTE — Assessment & Plan Note (Signed)
Cervical cancer screening performed according to ASCCP guidelines. Encouraged annual mammogram screening Colonoscopy UTD DXA, plan at age 62yo Labs and immunizations with her primary Encouraged safe sexual practices as indicated Encouraged healthy lifestyle practices with diet and exercise

## 2023-11-15 ENCOUNTER — Other Ambulatory Visit: Payer: Self-pay | Admitting: Obstetrics and Gynecology

## 2023-11-15 DIAGNOSIS — Z1231 Encounter for screening mammogram for malignant neoplasm of breast: Secondary | ICD-10-CM

## 2023-11-22 NOTE — Progress Notes (Signed)
 63 y.o. [id number] postmenopausal female with asthma HLD, HTN here for annual exam. Married. Stay at home mom. Mom has dementia, in assisted living in [place], [place]. Her brother is also in [place]. 2 children in [place]- one just had her 1st grandson, 7 months! Youngest daughter at [facility]. PCP: Stephane Leita DEL, MD   No LMP recorded. Patient is postmenopausal.   She reports no other concerns today. Some dyspareunia and low libido. Urine sample provided: No  Abnormal bleeding: none Pelvic discharge or pain: none Breast mass, nipple discharge or skin changes : none  Sexually active: Yes Birth control: None H/o abnl PAP: no Last PAP: No results found for: DIAGPAP, HPVHIGH, ADEQPAP Last mammogram: 9/217/24 density c; birads 1 neg, scheduled today Last colonoscopy:  2023 5 year f/u   Exercising: Yes, Barre, Yoga, Pickle Ball Smoker: No  Flowsheet Row Office Visit from 11/23/2023 in [facility]  PHQ-2 Total Score 0      GYN HISTORY: No sig hx  OB History  Gravida Para Term Preterm AB Living  3 3 3   3   SAB IAB Ectopic Multiple Live Births      3    # Outcome Date GA Lbr Len/2nd Weight Sex Type Anes PTL Lv  3 Term      Vag-Spont   LIV  2 Term      Vag-Spont   LIV  1 Term      Vag-Spont   LIV   Past Medical History:  Diagnosis Date   Asthma    Hypertension    History reviewed. No pertinent surgical history. Current Outpatient Medications on File Prior to Visit  Medication Sig Dispense Refill   AMLODIPINE BESYLATE PO Take by mouth.     BIOTIN PO Biotin     Calcium Carb-Cholecalciferol (CALCIUM 1000 + D PO) Take by mouth.     Coenzyme Q10 (COQ-10 PO) CoQ-10     Multiple Vitamins-Minerals (MULTIVITAMIN ADULT, MINERALS, PO) Multivitamin     Omega-3 Fatty Acids (FISH OIL) 1000 MG CPDR Take by mouth.     rosuvastatin (CRESTOR) 5 MG tablet Take 5 mg by mouth daily.     triamterene-hydrochlorothiazide (DYAZIDE) 37.5-25 MG capsule Take 1 capsule by mouth  daily.     XIIDRA 5 % SOLN Apply 1 drop to eye 2 (two) times daily.     No current facility-administered medications on file prior to visit.   Social History   Socioeconomic History   Marital status: Married    Spouse name: Not on file   Number of children: Not on file   Years of education: Not on file   Highest education level: Not on file  Occupational History   Not on file  Tobacco Use   Smoking status: Never   Smokeless tobacco: Never  Vaping Use   Vaping status: Never Used  Substance and Sexual Activity   Alcohol use: No   Drug use: No   Sexual activity: Yes    Birth control/protection: Post-menopausal  Other Topics Concern   Not on file  Social History Narrative   Not on file   Social Drivers of Health   Financial Resource Strain: Not on file  Food Insecurity: Not on file  Transportation Needs: Not on file  Physical Activity: Not on file  Stress: Not on file  Social Connections: Not on file  Intimate Partner Violence: Not on file   Family History  Problem Relation Age of Onset   Hypertension Father  Breast cancer Neg Hx    No Known Allergies   PE Today's Vitals   11/23/23 0933  BP: 110/62  Pulse: 79  Temp: 98.3 F (36.8 C)  TempSrc: Oral  SpO2: 96%  Weight: 118 lb (53.5 kg)  Height: 4' 11 (1.499 m)   Body mass index is 23.83 kg/m.  Physical Exam Vitals reviewed. Exam conducted with a chaperone present.  Constitutional:      General: She is not in acute distress.    Appearance: Normal appearance.  HENT:     Head: Normocephalic and atraumatic.     Nose: Nose normal.  Eyes:     Extraocular Movements: Extraocular movements intact.     Conjunctiva/sclera: Conjunctivae normal.  Neck:     Thyroid: No thyroid mass, thyromegaly or thyroid tenderness.  Pulmonary:     Effort: Pulmonary effort is normal.  Chest:     Chest wall: No mass or tenderness.  Breasts:    Right: Normal. No swelling, mass, nipple discharge, skin change or tenderness.      Left: Normal. No swelling, mass, nipple discharge, skin change or tenderness.  Abdominal:     General: There is no distension.     Palpations: Abdomen is soft.     Tenderness: There is no abdominal tenderness.  Genitourinary:    General: Normal vulva.     Exam position: Lithotomy position.     Urethra: No prolapse.     Vagina: Normal. No vaginal discharge or bleeding.     Cervix: Normal. No lesion.     Uterus: Normal. Not enlarged and not tender.      Adnexa: Right adnexa normal and left adnexa normal.     Comments: Mild vulvar atrophy Musculoskeletal:        General: Normal range of motion.     Cervical back: Normal range of motion.  Lymphadenopathy:     Upper Body:     Right upper body: No axillary adenopathy.     Left upper body: No axillary adenopathy.     Lower Body: No right inguinal adenopathy. No left inguinal adenopathy.  Skin:    General: Skin is warm and dry.  Neurological:     General: No focal deficit present.     Mental Status: She is alert.  Psychiatric:        Mood and Affect: Mood normal.        Behavior: Behavior normal.      Assessment and Plan:        Well woman exam with routine gynecological exam Assessment & Plan: Cervical cancer screening performed according to ASCCP guidelines. Encouraged annual mammogram screening Colonoscopy UTD DXA, plan at age 21yo Labs and immunizations with her primary Encouraged safe sexual practices as indicated Encouraged healthy lifestyle practices with diet and exercise    Cervical cancer screening -     Cytology - PAP  Negative depression screening  Genitourinary syndrome of menopause -     Estradiol; Apply 1/2 gram to vulva nightly for 2 weeks then decrease to 1/2 gram to vulva two nights a week. Do not use applicator.  Dispense: 42.5 g; Refill: 1   Vera LULLA Pa, MD

## 2023-11-23 ENCOUNTER — Other Ambulatory Visit (HOSPITAL_COMMUNITY)
Admission: RE | Admit: 2023-11-23 | Discharge: 2023-11-23 | Disposition: A | Discharge status: Home / Self Care | Source: Ambulatory Visit | Attending: Obstetrics and Gynecology | Admitting: Obstetrics and Gynecology

## 2023-11-23 ENCOUNTER — Ambulatory Visit
Admission: RE | Admit: 2023-11-23 | Discharge: 2023-11-23 | Disposition: A | Source: Ambulatory Visit | Attending: Obstetrics and Gynecology

## 2023-11-23 ENCOUNTER — Ambulatory Visit: Payer: No Typology Code available for payment source | Admitting: Obstetrics and Gynecology

## 2023-11-23 ENCOUNTER — Encounter: Payer: Self-pay | Admitting: Obstetrics and Gynecology

## 2023-11-23 VITALS — BP 110/62 | HR 79 | Temp 98.3°F | Ht 59.0 in | Wt 118.0 lb

## 2023-11-23 DIAGNOSIS — N958 Other specified menopausal and perimenopausal disorders: Secondary | ICD-10-CM

## 2023-11-23 DIAGNOSIS — Z1331 Encounter for screening for depression: Secondary | ICD-10-CM | POA: Diagnosis not present

## 2023-11-23 DIAGNOSIS — Z01419 Encounter for gynecological examination (general) (routine) without abnormal findings: Secondary | ICD-10-CM

## 2023-11-23 DIAGNOSIS — Z124 Encounter for screening for malignant neoplasm of cervix: Secondary | ICD-10-CM | POA: Insufficient documentation

## 2023-11-23 DIAGNOSIS — Z1231 Encounter for screening mammogram for malignant neoplasm of breast: Secondary | ICD-10-CM

## 2023-11-23 MED ORDER — ESTRADIOL 0.1 MG/GM VA CREA: TOPICAL_CREAM | VAGINAL | 1 refills | Status: AC

## 2023-11-23 NOTE — Patient Instructions (Signed)
 For patients under 50-63yo, I recommend 1200mg  calcium  daily and 600IU of vitamin D daily. For patients over 63yo, I recommend 1200mg  calcium  daily and 800IU of vitamin D daily.  Health Maintenance, Female Adopting a healthy lifestyle and getting preventive care are important in promoting health and wellness. Ask your health care provider about: The right schedule for you to have regular tests and exams. Things you can do on your own to prevent diseases and keep yourself healthy. What should I know about diet, weight, and exercise? Eat a healthy diet  Eat a diet that includes plenty of vegetables, fruits, low-fat dairy products, and lean protein. Do not eat a lot of foods that are high in solid fats, added sugars, or sodium. Maintain a healthy weight Body mass index (BMI) is used to identify weight problems. It estimates body fat based on height and weight. Your health care provider can help determine your BMI and help you achieve or maintain a healthy weight. Get regular exercise Get regular exercise. This is one of the most important things you can do for your health. Most adults should: Exercise for at least 150 minutes each week. The exercise should increase your heart rate and make you sweat (moderate-intensity exercise). Do strengthening exercises at least twice a week. This is in addition to the moderate-intensity exercise. Spend less time sitting. Even light physical activity can be beneficial. Watch cholesterol and blood lipids Have your blood tested for lipids and cholesterol at 63 years of age, then have this test every 5 years. Have your cholesterol levels checked more often if: Your lipid or cholesterol levels are high. You are older than 63 years of age. You are at high risk for heart disease. What should I know about cancer screening? Depending on your health history and family history, you may need to have cancer screening at various ages. This may include screening  for: Breast cancer. Cervical cancer. Colorectal cancer. Skin cancer. Lung cancer. What should I know about heart disease, diabetes, and high blood pressure? Blood pressure and heart disease High blood pressure causes heart disease and increases the risk of stroke. This is more likely to develop in people who have high blood pressure readings or are overweight. Have your blood pressure checked: Every 3-5 years if you are 63-63 years of age. Every year if you are 63 years old or older. Diabetes Have regular diabetes screenings. This checks your fasting blood sugar level. Have the screening done: Once every three years after age 63 if you are at a normal weight and have a low risk for diabetes. More often and at a younger age if you are overweight or have a high risk for diabetes. What should I know about preventing infection? Hepatitis B If you have a higher risk for hepatitis B, you should be screened for this virus. Talk with your health care provider to find out if you are at risk for hepatitis B infection. Hepatitis C Testing is recommended for: Everyone born from [birth year] through 1965. Anyone with known risk factors for hepatitis C. Sexually transmitted infections (STIs) Get screened for STIs, including gonorrhea and chlamydia, if: You are sexually active and are younger than 63 years of age. You are older than 63 years of age and your health care provider tells you that you are at risk for this type of infection. Your sexual activity has changed since you were last screened, and you are at increased risk for chlamydia or gonorrhea. Ask your health care provider if  you are at risk. Ask your health care provider about whether you are at high risk for HIV. Your health care provider may recommend a prescription medicine to help prevent HIV infection. If you choose to take medicine to prevent HIV, you should first get tested for HIV. You should then be tested every 3 months for as long as you  are taking the medicine. Osteoporosis and menopause Osteoporosis is a disease in which the bones lose minerals and strength with aging. This can result in bone fractures. If you are 63 years old or older, or if you are at risk for osteoporosis and fractures, ask your health care provider if you should: Be screened for bone loss. Take a calcium  or vitamin D supplement to lower your risk of fractures. Be given hormone replacement therapy (HRT) to treat symptoms of menopause. Follow these instructions at home: Alcohol use Do not drink alcohol if: Your health care provider tells you not to drink. You are pregnant, may be pregnant, or are planning to become pregnant. If you drink alcohol: Limit how much you have to: 0-1 drink a day. Know how much alcohol is in your drink. In the [place] one drink equals one 12 oz bottle of beer (355 mL), one 5 oz glass of wine (148 mL), or one 1 oz glass of hard liquor (44 mL). Lifestyle Do not use any products that contain nicotine or tobacco. These products include cigarettes, chewing tobacco, and vaping devices, such as e-cigarettes. If you need help quitting, ask your health care provider. Do not use street drugs. Do not share needles. Ask your health care provider for help if you need support or information about quitting drugs. General instructions Schedule regular health, dental, and eye exams. Stay current with your vaccines. Tell your health care provider if: You often feel depressed. You have ever been abused or do not feel safe at home. Summary Adopting a healthy lifestyle and getting preventive care are important in promoting health and wellness. Follow your health care provider's instructions about healthy diet, exercising, and getting tested or screened for diseases. Follow your health care provider's instructions on monitoring your cholesterol and blood pressure. This information is not intended to replace advice given to you by your health  care provider. Make sure you discuss any questions you have with your health care provider. Document Revised: 06/23/2020 Document Reviewed: 06/23/2020 Elsevier Patient Education  2024 [employer].

## 2023-11-23 NOTE — Assessment & Plan Note (Signed)
Cervical cancer screening performed according to ASCCP guidelines. Encouraged annual mammogram screening Colonoscopy UTD DXA, plan at age 63yo Labs and immunizations with her primary Encouraged safe sexual practices as indicated Encouraged healthy lifestyle practices with diet and exercise

## 2023-11-28 LAB — CYTOLOGY - PAP
Comment: NEGATIVE
Diagnosis: NEGATIVE
High risk HPV: NEGATIVE

## 2023-11-29 ENCOUNTER — Ambulatory Visit: Payer: Self-pay | Admitting: Obstetrics and Gynecology

## 2023-12-14 ENCOUNTER — Other Ambulatory Visit (HOSPITAL_BASED_OUTPATIENT_CLINIC_OR_DEPARTMENT_OTHER): Payer: Self-pay | Admitting: Internal Medicine

## 2023-12-14 DIAGNOSIS — E785 Hyperlipidemia, unspecified: Secondary | ICD-10-CM

## 2024-01-06 ENCOUNTER — Ambulatory Visit (HOSPITAL_BASED_OUTPATIENT_CLINIC_OR_DEPARTMENT_OTHER)
Admission: RE | Admit: 2024-01-06 | Discharge: 2024-01-06 | Disposition: A | Discharge status: Home / Self Care | Payer: Self-pay | Source: Ambulatory Visit | Attending: Internal Medicine | Admitting: Internal Medicine

## 2024-01-06 DIAGNOSIS — E785 Hyperlipidemia, unspecified: Secondary | ICD-10-CM | POA: Insufficient documentation

## 2024-11-27 ENCOUNTER — Ambulatory Visit: Admitting: Obstetrics and Gynecology
